# Patient Record
Sex: Female | Born: 1945 | Race: White | Hispanic: No | Smoking: Never smoker
Health system: Southern US, Community
[De-identification: ages and names within clinical notes are randomized; demographics above are authoritative.]

## PROBLEM LIST (undated history)

## (undated) DIAGNOSIS — M1990 Unspecified osteoarthritis, unspecified site: Secondary | ICD-10-CM

## (undated) DIAGNOSIS — K589 Irritable bowel syndrome without diarrhea: Secondary | ICD-10-CM

## (undated) HISTORY — PX: CHOLECYSTECTOMY: SHX55

## (undated) HISTORY — PX: OSTEOTOMY: SHX137

---

## 2012-02-14 ENCOUNTER — Ambulatory Visit: Payer: Self-pay | Admitting: Internal Medicine

## 2012-09-02 ENCOUNTER — Ambulatory Visit: Payer: Self-pay | Admitting: Internal Medicine

## 2014-01-18 ENCOUNTER — Ambulatory Visit: Payer: Self-pay | Admitting: Internal Medicine

## 2015-02-07 ENCOUNTER — Ambulatory Visit
Admit: 2015-02-07 | Disposition: A | Discharge status: Home / Self Care | Payer: Self-pay | Attending: Internal Medicine | Admitting: Internal Medicine

## 2015-07-22 ENCOUNTER — Ambulatory Visit
Admission: EM | Admit: 2015-07-22 | Discharge: 2015-07-22 | Disposition: A | Discharge status: Other Acute Inpatient Hospital | Payer: 59 | Attending: Family Medicine | Admitting: Family Medicine

## 2015-07-22 ENCOUNTER — Ambulatory Visit: Payer: 59

## 2015-07-22 ENCOUNTER — Encounter: Payer: Self-pay | Admitting: Emergency Medicine

## 2015-07-22 DIAGNOSIS — R100 Acute abdomen: Secondary | ICD-10-CM

## 2015-07-22 DIAGNOSIS — I10 Essential (primary) hypertension: Secondary | ICD-10-CM | POA: Diagnosis not present

## 2015-07-22 DIAGNOSIS — I160 Hypertensive urgency: Secondary | ICD-10-CM

## 2015-07-22 DIAGNOSIS — R109 Unspecified abdominal pain: Secondary | ICD-10-CM

## 2015-07-22 HISTORY — DX: Irritable bowel syndrome, unspecified: K58.9

## 2015-07-22 LAB — COMPREHENSIVE METABOLIC PANEL
ALK PHOS: 57 U/L (ref 38–126)
ALT: 17 U/L (ref 14–54)
AST: 35 U/L (ref 15–41)
Albumin: 4.4 g/dL (ref 3.5–5.0)
Anion gap: 10 (ref 5–15)
BUN: 16 mg/dL (ref 6–20)
CALCIUM: 9.3 mg/dL (ref 8.9–10.3)
CO2: 26 mmol/L (ref 22–32)
CREATININE: 0.76 mg/dL (ref 0.44–1.00)
Chloride: 101 mmol/L (ref 101–111)
GFR calc non Af Amer: >60 mL/min (ref >60)
GLUCOSE: 175 mg/dL — AB (ref 65–99)
Potassium: 3 mmol/L — ABNORMAL LOW (ref 3.5–5.1)
SODIUM: 137 mmol/L (ref 135–145)
Total Bilirubin: 1.9 mg/dL — ABNORMAL HIGH (ref 0.3–1.2)
Total Protein: 7.3 g/dL (ref 6.5–8.1)

## 2015-07-22 LAB — CBC WITH DIFFERENTIAL/PLATELET
Basophils Absolute: 0.1 10*3/uL (ref 0–0.1)
Basophils Relative: 1 %
EOS ABS: 0.1 10*3/uL (ref 0–0.7)
Eosinophils Relative: 1 %
HCT: 43.2 % (ref 35.0–47.0)
HEMOGLOBIN: 14.4 g/dL (ref 12.0–16.0)
LYMPHS ABS: 2.6 10*3/uL (ref 1.0–3.6)
LYMPHS PCT: 26 %
MCH: 29.8 pg (ref 26.0–34.0)
MCHC: 33.3 g/dL (ref 32.0–36.0)
MCV: 89.6 fL (ref 80.0–100.0)
Monocytes Absolute: 0.8 10*3/uL (ref 0.2–0.9)
Monocytes Relative: 9 %
NEUTROS ABS: 6.3 10*3/uL (ref 1.4–6.5)
NEUTROS PCT: 63 %
Platelets: 245 10*3/uL (ref 150–440)
RBC: 4.82 MIL/uL (ref 3.80–5.20)
RDW: 12.8 % (ref 11.5–14.5)
WBC: 9.9 10*3/uL (ref 3.6–11.0)

## 2015-07-22 MED ORDER — CLONIDINE HCL 0.1 MG PO TABS
0.1000 mg | ORAL_TABLET | Freq: Once | ORAL | Status: AC
  Administered 2015-07-22: 0.1 mg via ORAL

## 2015-07-22 MED ORDER — ONDANSETRON HCL 4 MG/2ML IJ SOLN
4.0000 mg | Freq: Once | INTRAMUSCULAR | Status: AC
  Administered 2015-07-22: 4 mg via INTRAVENOUS

## 2015-07-22 MED ORDER — ONDANSETRON 8 MG PO TBDP
8.0000 mg | ORAL_TABLET | Freq: Once | ORAL | Status: AC
  Administered 2015-07-22: 8 mg via ORAL

## 2015-07-22 MED ORDER — HYDROMORPHONE HCL 1 MG/ML IJ SOLN
1.0000 mg | Freq: Once | INTRAMUSCULAR | Status: AC
  Administered 2015-07-22: 1 mg via INTRAMUSCULAR

## 2015-07-22 NOTE — ED Notes (Signed)
Called the EMS to be transferred to [facility] ED

## 2015-07-22 NOTE — ED Notes (Signed)
Patient presents here with c/o severe acute onset of abdominal cramping since this am , states that she has had the pain on and off for 2 weeks now, had 3 episodes of vomiting since this am. LBM 07/22/2015

## 2015-07-22 NOTE — ED Provider Notes (Signed)
CSN: [id number]     Arrival date & time 07/22/15  1325 History   First MD Initiated Contact with Patient 07/22/15 1406     Chief Complaint  Patient presents with  . Abdominal Pain  . Nausea  . Emesis   (Consider location/radiation/quality/duration/timing/severity/associated sxs/prior Treatment) HPI Comments: 69 yo female with a h/o irritable bowel syndrome presents with a 2 weeks h/o abdominal pain "on and off" but sudden onset of severe pain intensity today associated with vomiting. Has vomited "bile looking" material three times  since this morning. Denies any chest pains, shortness of breath, fevers, chills. Also denies history of hypertension.    The history is provided by the patient.    Past Medical History  Diagnosis Date  . IBS (irritable bowel syndrome)    History reviewed. No pertinent past surgical history. No family history on file. Social History  Substance Use Topics  . Smoking status: Never Smoker   . Smokeless tobacco: None  . Alcohol Use: No   OB History    No data available     Review of Systems  Allergies  Review of patient's allergies indicates no known allergies.  Home Medications   Prior to Admission medications   Medication Sig Start Date End Date Taking? Authorizing Provider  Acetaminophen (ARTHRITIS PAIN PO) Take by mouth.   Yes Historical Provider, MD   Meds Ordered and Administered this Visit   Medications  HYDROmorphone (DILAUDID) injection 1 mg (1 mg Intramuscular Given 07/22/15 1330)  ondansetron (ZOFRAN-ODT) disintegrating tablet 8 mg (8 mg Oral Given 07/22/15 1330)  cloNIDine (CATAPRES) tablet 0.1 mg (0.1 mg Oral Given 07/22/15 1405)  ondansetron (ZOFRAN) injection 4 mg (4 mg Intravenous Given 07/22/15 1357)  cloNIDine (CATAPRES) tablet 0.1 mg (0.1 mg Oral Given 07/22/15 1439)    BP 221/100 mmHg  Pulse 96  Temp(Src) 98.6 F (37 C) (Tympanic)  Resp 24  Ht  (1.575 m)  Wt 173 lb (78.472 kg)  BMI 31.63 kg/m2  SpO2 100% No data  found.   Physical Exam  Constitutional: She appears well-developed and well-nourished. No distress.  HENT:  Head: Normocephalic.  Mouth/Throat: Mucous membranes are normal.  Eyes: Pupils are equal, round, and reactive to light. Right eye exhibits no discharge.  Neck: Neck supple.  Cardiovascular: Normal rate, regular rhythm, normal heart sounds and intact distal pulses.   No murmur heard. Pulmonary/Chest: Effort normal and breath sounds normal. No respiratory distress. She has no wheezes. She has no rales. She exhibits no tenderness.  Abdominal: Soft. Bowel sounds are normal. She exhibits distension (mild distention). She exhibits no mass. There is tenderness (moderate diffuse tenderness to palpation). There is no rebound and no guarding.  Musculoskeletal: She exhibits no edema.  Lymphadenopathy:    She has no cervical adenopathy.  Neurological: She is alert.  Skin: She is not diaphoretic.  Nursing note and vitals reviewed.   ED Course  Procedures (including critical care time)  Labs Review Labs Reviewed  COMPREHENSIVE METABOLIC PANEL - Abnormal; Notable for the following:    Potassium 3.0 (*)    Glucose, Bld 175 (*)    Total Bilirubin 1.9 (*)    All other components within normal limits  CBC WITH DIFFERENTIAL/PLATELET    Imaging Review Dg Abd 2 Views  07/22/2015   CLINICAL DATA:  Patient with central abdominal pain and vomiting.  EXAM: ABDOMEN - 2 VIEW  COMPARISON:  None.  FINDINGS: Possible nodular opacity within the right hilar location versus overlapping tissues. The remainder  of the lung bases are clear. Stool within the cecum and ascending colon. Relative paucity of small bowel gas. No evidence for overt obstruction. No evidence for free intraperitoneal air. Tubular gas demonstrated within the expected location of the central liver.  IMPRESSION: Tubular gas-filled structure within the central aspect the liver is favored to represent pneumobilia. Recommend clinical  correlation for possible history of sphincterotomy.  Nonobstructed bowel gas pattern.  Stool throughout the colon as can be seen with constipation.  Possible nodular density projecting over the right hilum, favored to be secondary to overlapping tissue. Recommend correlation with chest radiograph.   Electronically Signed   By: Annia Belt M.D.   On: 07/22/2015 14:54     Visual Acuity Review  Right Eye Distance:   Left Eye Distance:   Bilateral Distance:    Right Eye Near:   Left Eye Near:    Bilateral Near:         MDM   1. Sudden onset of severe abdominal pain   2. Hypertensive urgency   (unknown etiology)  Plan: 1. Patient given Dilaudid  IM x 1, zofran  po x1 (vomited); zofran  IV x1; clonidine 0.1mg  po x2 2.Test/x-ray results reviewed with patient and family; recommend patient go to ED for further evaluation and management; patient transported by EMS to ED in stable condition   Payton Mccallum, MD 07/22/15 1533

## 2017-07-07 ENCOUNTER — Other Ambulatory Visit: Payer: Self-pay | Admitting: Pediatrics

## 2017-07-07 DIAGNOSIS — Z1231 Encounter for screening mammogram for malignant neoplasm of breast: Secondary | ICD-10-CM

## 2017-10-01 ENCOUNTER — Ambulatory Visit
Admission: RE | Admit: 2017-10-01 | Discharge: 2017-10-01 | Disposition: A | Discharge status: Home / Self Care | Payer: Medicare Other | Source: Ambulatory Visit | Attending: Pediatrics | Admitting: Pediatrics

## 2017-10-01 DIAGNOSIS — Z1231 Encounter for screening mammogram for malignant neoplasm of breast: Secondary | ICD-10-CM | POA: Insufficient documentation

## 2018-11-12 ENCOUNTER — Other Ambulatory Visit: Payer: Self-pay | Admitting: Pediatrics

## 2018-11-12 DIAGNOSIS — Z1231 Encounter for screening mammogram for malignant neoplasm of breast: Secondary | ICD-10-CM

## 2018-11-30 ENCOUNTER — Ambulatory Visit: Payer: Medicare Other

## 2018-12-07 ENCOUNTER — Encounter (INDEPENDENT_AMBULATORY_CARE_PROVIDER_SITE_OTHER): Payer: Self-pay

## 2018-12-07 ENCOUNTER — Ambulatory Visit
Admission: RE | Admit: 2018-12-07 | Discharge: 2018-12-07 | Disposition: A | Discharge status: Home / Self Care | Payer: Medicare Other | Source: Ambulatory Visit | Attending: Pediatrics | Admitting: Pediatrics

## 2018-12-07 DIAGNOSIS — Z1231 Encounter for screening mammogram for malignant neoplasm of breast: Secondary | ICD-10-CM | POA: Diagnosis present

## 2019-04-26 ENCOUNTER — Other Ambulatory Visit: Payer: Self-pay | Admitting: Pediatrics

## 2019-04-26 DIAGNOSIS — M8589 Other specified disorders of bone density and structure, multiple sites: Secondary | ICD-10-CM

## 2019-06-16 ENCOUNTER — Encounter (INDEPENDENT_AMBULATORY_CARE_PROVIDER_SITE_OTHER): Payer: Self-pay

## 2019-06-16 ENCOUNTER — Other Ambulatory Visit: Payer: Self-pay

## 2019-06-16 ENCOUNTER — Ambulatory Visit
Admission: RE | Admit: 2019-06-16 | Discharge: 2019-06-16 | Disposition: A | Discharge status: Home / Self Care | Payer: Medicare Other | Source: Ambulatory Visit | Attending: Pediatrics | Admitting: Pediatrics

## 2019-06-16 DIAGNOSIS — M8589 Other specified disorders of bone density and structure, multiple sites: Secondary | ICD-10-CM | POA: Diagnosis not present

## 2019-11-07 IMAGING — MG DIGITAL SCREENING BILATERAL MAMMOGRAM WITH TOMO AND CAD
8 series · 8 of 24 positions shown · non-contrast
Comparison: Previous exam(s).

CLINICAL DATA: Screening.

EXAM:
DIGITAL SCREENING BILATERAL MAMMOGRAM WITH TOMO AND CAD

[R CC synth-2D]
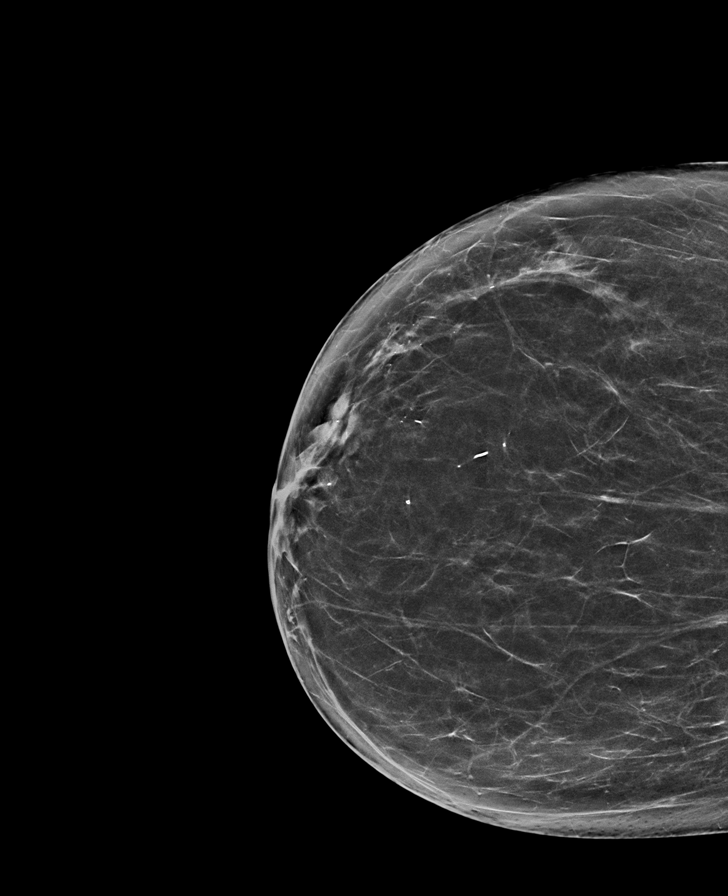

[L MLO synth-2D]
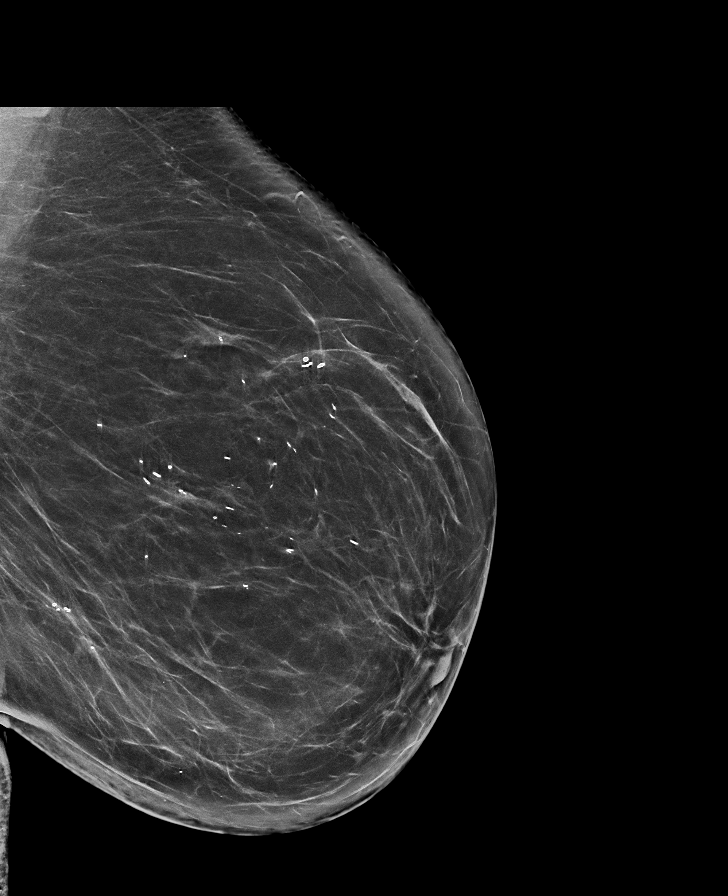

[R MLO synth-2D]
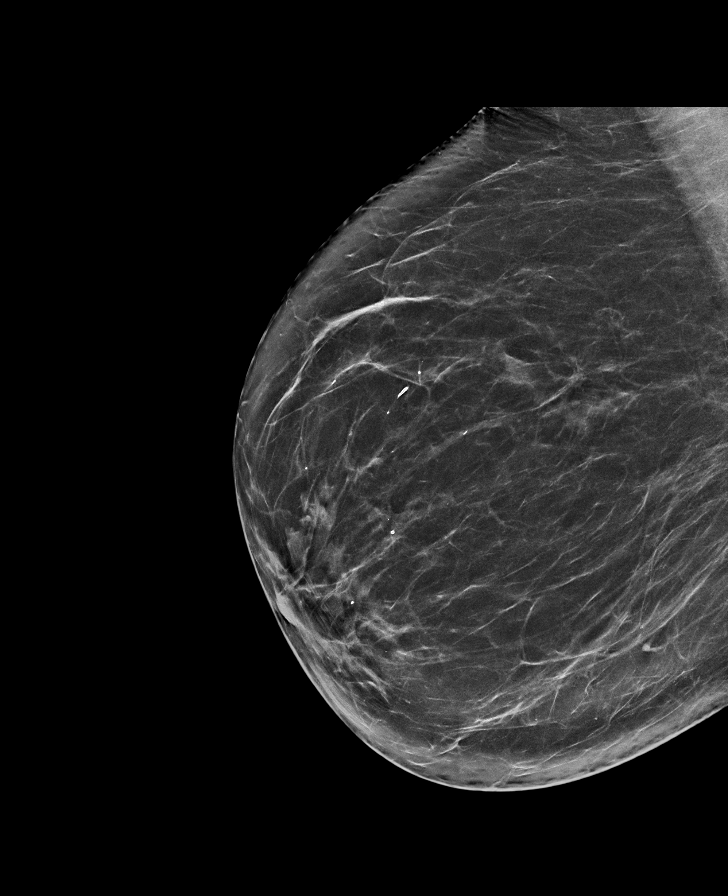

[L CC synth-2D]
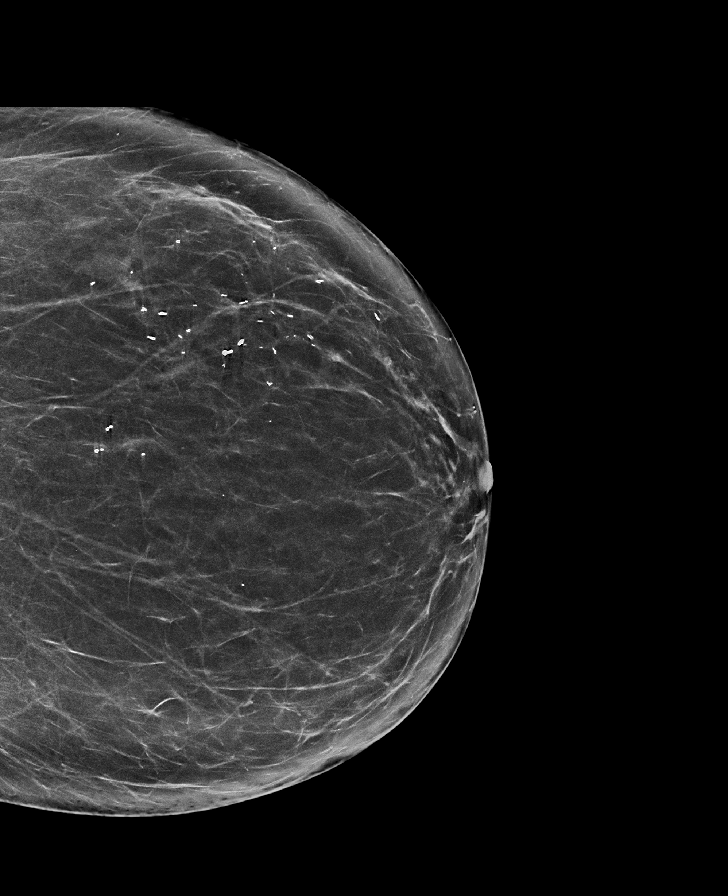

[L CC tomo · tomo slice 39/76.0]
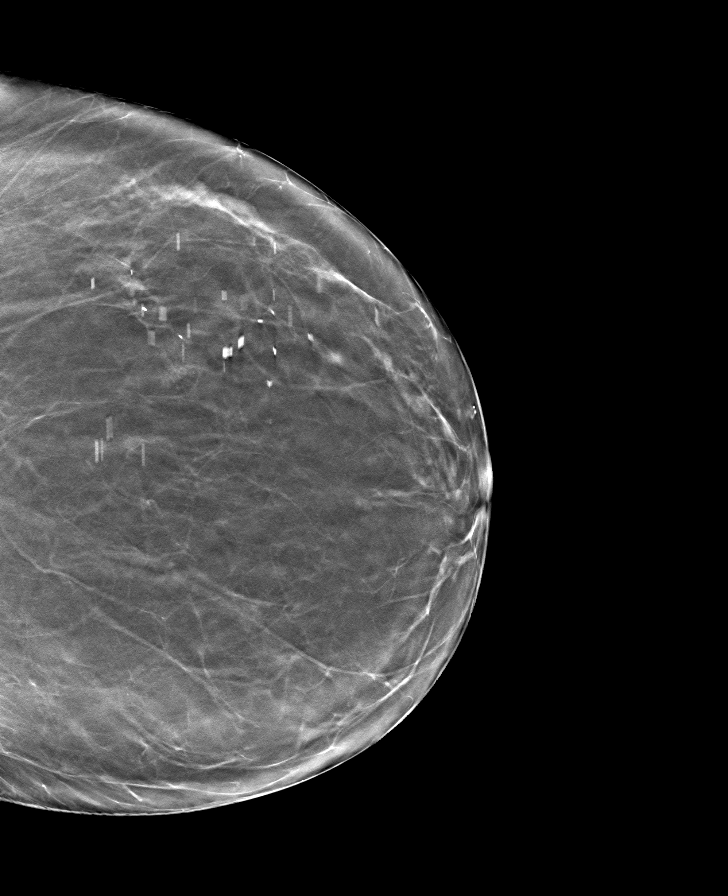

[R MLO tomo · tomo slice 39/77.0]
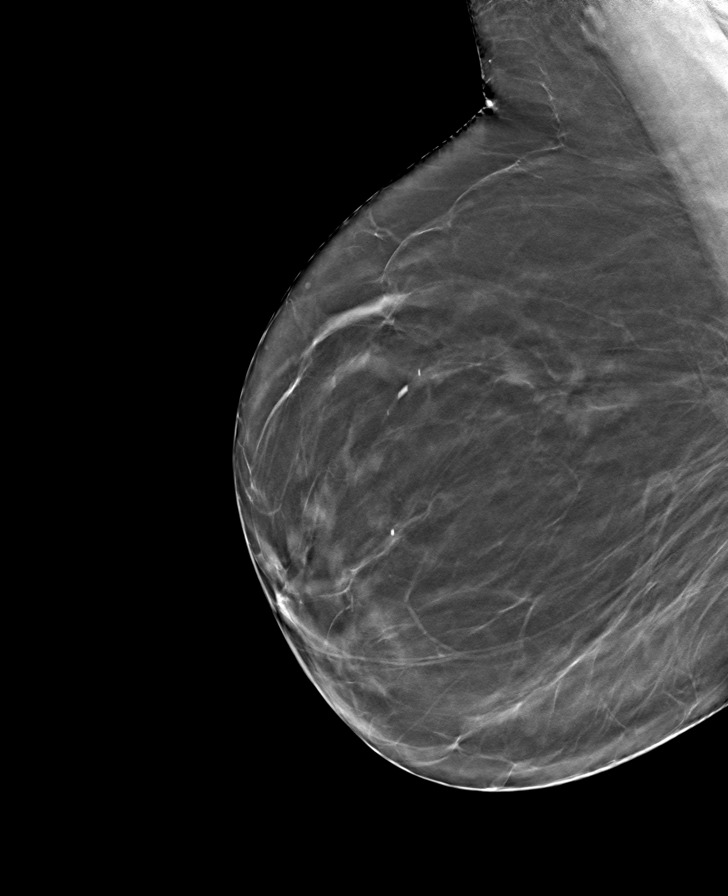

[L MLO tomo · tomo slice 41/80.0]
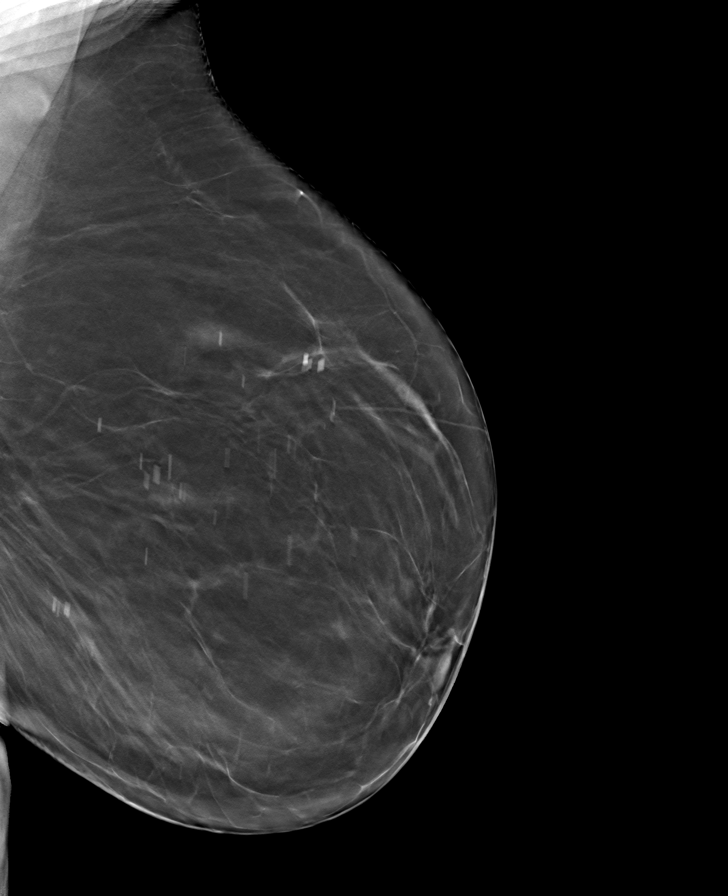

[R CC tomo · tomo slice 37/73.0]
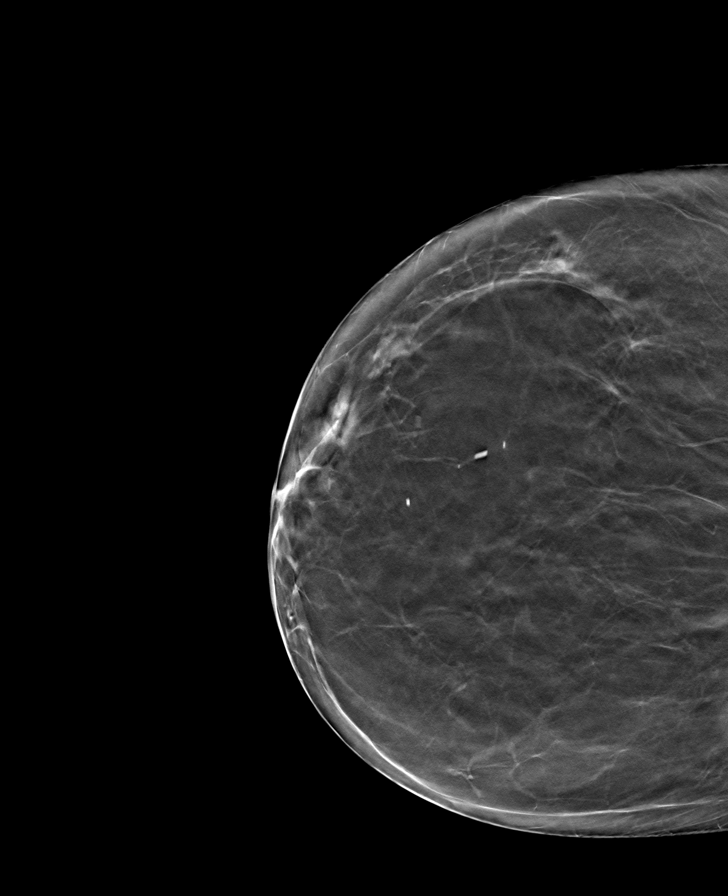

[8 of 24 positions shown; findings below may reference images not displayed]

ACR Breast Density Category b: There are scattered areas of
fibroglandular density.
FINDINGS: There are no findings suspicious for malignancy. Images were
processed with CAD.
IMPRESSION: No mammographic evidence of malignancy. A result letter of this
screening mammogram will be mailed directly to the patient.

RECOMMENDATION:
Screening mammogram in one year. (Code:CN-U-775)

BI-RADS CATEGORY  1: Negative.

## 2019-11-10 ENCOUNTER — Other Ambulatory Visit: Payer: Self-pay

## 2019-11-10 ENCOUNTER — Ambulatory Visit
Admission: EM | Admit: 2019-11-10 | Discharge: 2019-11-10 | Discharge status: Against Medical Advice | Payer: Medicare Other | Attending: Emergency Medicine | Admitting: Emergency Medicine

## 2019-11-10 ENCOUNTER — Encounter: Payer: Self-pay | Admitting: Emergency Medicine

## 2019-11-10 DIAGNOSIS — R079 Chest pain, unspecified: Secondary | ICD-10-CM | POA: Diagnosis not present

## 2019-11-10 DIAGNOSIS — R0789 Other chest pain: Secondary | ICD-10-CM

## 2019-11-10 DIAGNOSIS — R002 Palpitations: Secondary | ICD-10-CM | POA: Insufficient documentation

## 2019-11-10 HISTORY — DX: Unspecified osteoarthritis, unspecified site: M19.90

## 2019-11-10 NOTE — Discharge Instructions (Addendum)
Go directly to emergency room.  

## 2019-11-10 NOTE — ED Triage Notes (Signed)
Patient in today c/o chest pain, palpitations and dizziness since this morning. Patient states she has had these issues in the past. Patient has a cardiologist, Dr. Joneen Roach at [facility].

## 2019-11-10 NOTE — ED Provider Notes (Signed)
[facility] ____________________________________________  Time seen: Approximately 12:43 PM  I have reviewed the triage vital signs and the nursing notes.   HISTORY  Chief Complaint Chest Pain, Palpitations, and Dizziness   HPI Susan Holden is a 74 y.o. female presenting for evaluation of heart racing sensation since around 10 AM this morning.  Patient reports she has had this before, but reports the symptoms have continued.  States since this morning she felt like her heart rate was increased and was having some central chest discomfort and pressure accompanying this.  Patient reports when her heart rate goes up she has shortness of breath but once the heart rate comes back down the shortness of breath resolves.  States the palpitations incision has not fully resolved but has intermittently gotten somewhat better today.  Mild chest discomfort described as mid pressure at this time.  No current shortness of breath.  Some intermittent lightheadedness.  Denies pain radiation, paresthesias, vision changes, headache, extremity edema, cough, fever, recent sickness.  Denies anxiety.  States she checked her blood pressure and heart rate at home with heart rate going up to 150.  Has been previously seen and evaluated by cardiology at [facility] for some similar complaints.  States at that time she had a Holter monitor and was noted that her heart rate was going up.  However reports that as her symptoms had started getting better and she is having less episodes no intervention was made at that time.  Denies other cardiac history.  No recent sickness.   Past Medical History:  Diagnosis Date  . Arthritis   . IBS (irritable bowel syndrome)     There are no problems to display for this patient.   Past Surgical History:  Procedure Laterality Date  . CHOLECYSTECTOMY    . OSTEOTOMY Left      No current facility-administered medications for this encounter.  Current Outpatient  Medications:  .  Acetaminophen (ARTHRITIS PAIN PO), Take by mouth., Disp: , Rfl:  .  famotidine (PEPCID) 10 MG tablet, Take by mouth., Disp: , Rfl:  .  SUMAtriptan (IMITREX) 50 MG tablet, Take one at first sign of migraine. May repeat once after two hours if needed., Disp: , Rfl:   Allergies Patient has no known allergies.  Family History  Problem Relation Age of Onset  . Breast cancer Mother 60  . Hypertension Mother   . Diabetes Father   . Other Father        sepsis    Social History Social History   Tobacco Use  . Smoking status: Never Smoker  . Smokeless tobacco: Never Used  Substance Use Topics  . Alcohol use: Yes    Comment: rarely  . Drug use: Never    Review of Systems Constitutional: No fever ENT: No sore throat. Cardiovascular: Positive chest pain. Respiratory: Intermittent shortness of breath. Gastrointestinal: No abdominal pain.  No nausea, no vomiting.  No diarrhea.   Genitourinary: Negative for dysuria. Musculoskeletal: Negative for back pain. Skin: Negative for rash. Neurological: Negative for headaches, focal weakness or numbness.   ____________________________________________   PHYSICAL EXAM:  VITAL SIGNS: ED Triage Vitals  Enc Vitals Group     BP 11/10/19 1220 (!) 149/96     Pulse Rate 11/10/19 1220 (!) 106     Resp 11/10/19 1220 18     Temp 11/10/19 1220 97.6 F (36.4 C)     Temp Source 11/10/19 1220 Oral     SpO2 11/10/19 1220 100 %  Weight 11/10/19 1216 169 lb (76.7 kg)     Height 11/10/19 1216 5\' 1"  (1.549 m)     Head Circumference --      Peak Flow --      Pain Score 11/10/19 1215 2     Pain Loc --      Pain Edu? --      Excl. in [place]? --     Constitutional: Alert and oriented. Well appearing and in no acute distress. Eyes: Conjunctivae are normal.  ENT      Head: Normocephalic and atraumatic. Cardiovascular: Regular rhythm.  Tachycardic.  Good peripheral circulation. Respiratory: Normal respiratory effort without  tachypnea nor retractions. Breath sounds are clear and equal bilaterally. No wheezes, rales, rhonchi. Musculoskeletal no extremity edema noted bilaterally. Neurologic:  Normal speech and language. Speech is normal. No gait instability.  Skin:  Skin is warm, dry and intact. No rash noted. Psychiatric: Mood and affect are normal. Speech and behavior are normal. Patient exhibits appropriate insight and judgment   ___________________________________________   LABS (all labs ordered are listed, but only abnormal results are displayed)  Labs Reviewed - No data to display ____________________________________________  EKG  ED ECG REPORT I, Marylene Land, the attending provider, personally viewed and interpreted this ECG.   Date: 11/10/2019  EKG Time: 108  Rate: 108  Rhythm: sinus tachycardia, right bundle branch block, inferior infarct  Axis: left axis deviation  ST&T Change:  No visible EKG available for comparison, similar to previous dictated reports by [facility]  ED ECG REPORT I, Marylene Land, the attending provider, personally viewed and interpreted this ECG.   Date: 11/10/2019  EKG Time: 1307  Rate: 112  Rhythm: Sinus tachycardia, right bundle branch block, inferior infarct  Axis: Left axis deviation  ST&T Change: none   RADIOLOGY  No results found. ____________________________________________   PROCEDURES Procedures    INITIAL IMPRESSION / ASSESSMENT AND PLAN / ED COURSE  Pertinent labs & imaging results that were available during my care of the patient were reviewed by me and considered in my medical decision making (see chart for details).  Overall well-appearing patient.  Does not appear anxious.  Palpitation sensation with chest pressure.  Present since 10 AM this morning.  Multiple differentials discussed with patient including recommendations for further evaluation emergency room at this time for further cardiac evaluation..  Patient verbalized understanding  and states that she will go to [facility].  Recommend EMS transfer, patient declined this and states that her friend will take her to [facility].  Patient alert and oriented with decisional capacity and verbalized understanding [place] as well as verbalized understanding of risks.  Patient states she will go directly to [facility]  ____________________________________________   FINAL CLINICAL IMPRESSION(S) / ED DIAGNOSES  Final diagnoses:  Palpitations  Chest pressure     ED Discharge Orders    None       Note: This dictation was prepared with Dragon dictation along with smaller phrase technology. Any transcriptional errors that result from this process are unintentional.         Marylene Land, NP 11/10/19 1340

## 2023-04-29 ENCOUNTER — Ambulatory Visit: Payer: Medicare Other | Admitting: Physical Therapy

## 2023-04-29 ENCOUNTER — Other Ambulatory Visit: Payer: Self-pay

## 2023-04-29 DIAGNOSIS — Z780 Asymptomatic menopausal state: Secondary | ICD-10-CM

## 2023-05-06 ENCOUNTER — Encounter: Payer: Medicare Other | Admitting: Physical Therapy

## 2023-05-08 ENCOUNTER — Encounter: Payer: Medicare Other | Admitting: Physical Therapy

## 2023-05-13 ENCOUNTER — Encounter: Payer: Medicare Other | Admitting: Physical Therapy

## 2023-05-15 ENCOUNTER — Encounter: Payer: Medicare Other | Admitting: Physical Therapy

## 2023-05-20 ENCOUNTER — Encounter: Payer: Medicare Other | Admitting: Physical Therapy

## 2023-05-22 ENCOUNTER — Encounter: Payer: Medicare Other | Admitting: Physical Therapy

## 2023-05-27 ENCOUNTER — Encounter: Payer: Medicare Other | Admitting: Physical Therapy

## 2023-05-29 ENCOUNTER — Encounter: Payer: Medicare Other | Admitting: Physical Therapy

## 2023-06-02 ENCOUNTER — Ambulatory Visit
Admission: RE | Admit: 2023-06-02 | Discharge: 2023-06-02 | Disposition: A | Discharge status: Home / Self Care | Payer: Medicare Other | Source: Ambulatory Visit | Attending: Student | Admitting: Student

## 2023-06-02 DIAGNOSIS — Z780 Asymptomatic menopausal state: Secondary | ICD-10-CM | POA: Insufficient documentation

## 2023-06-03 ENCOUNTER — Encounter: Payer: Medicare Other | Admitting: Physical Therapy

## 2023-06-05 ENCOUNTER — Encounter: Payer: Medicare Other | Admitting: Physical Therapy
